# Patient Record
Sex: Male | Born: 1995 | Race: Black or African American | Marital: Single | Smoking: Current some day smoker
Health system: Northeastern US, Academic
[De-identification: ages and names within clinical notes are randomized; demographics above are authoritative.]

---
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
## 2015-01-30 IMAGING — US US RENAL
1 series · 14 of 25 positions shown · non-contrast
Comparison: None.

CLINICAL DATA: 17-year-old male with hypertension.

EXAM:
RENAL/URINARY TRACT ULTRASOUND COMPLETE

[Series 1: us renal · 0.33mm/px · 14 of 34 slices shown]
[im 1/34]
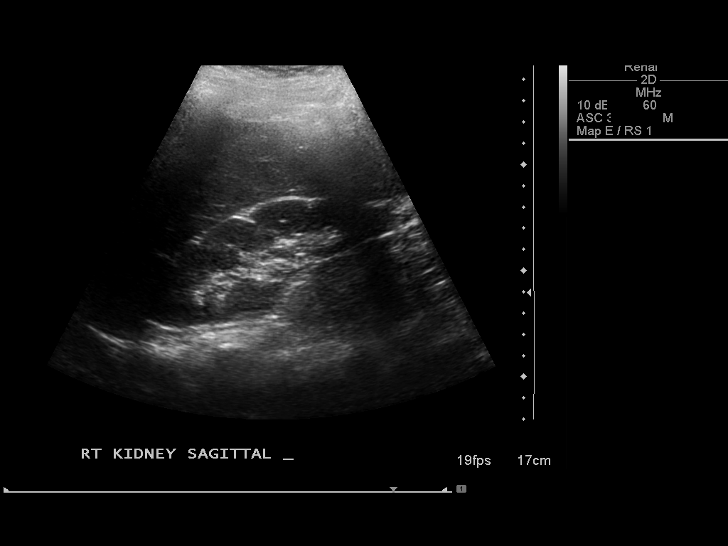
[im 3/34]
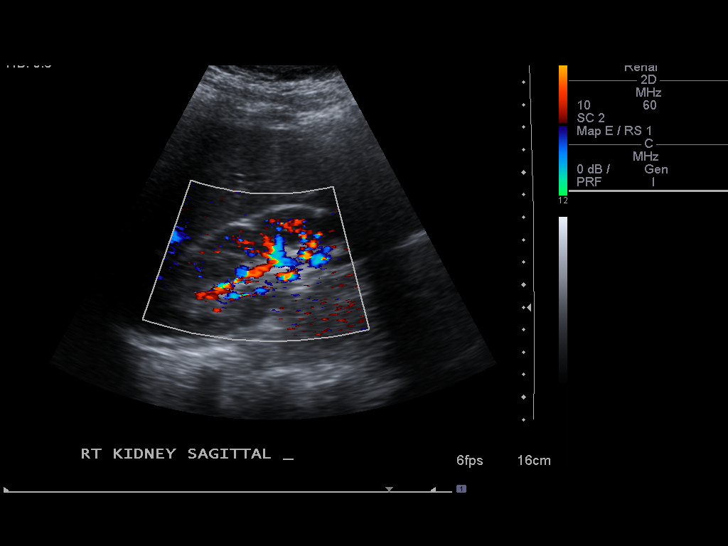
[im 6/34]
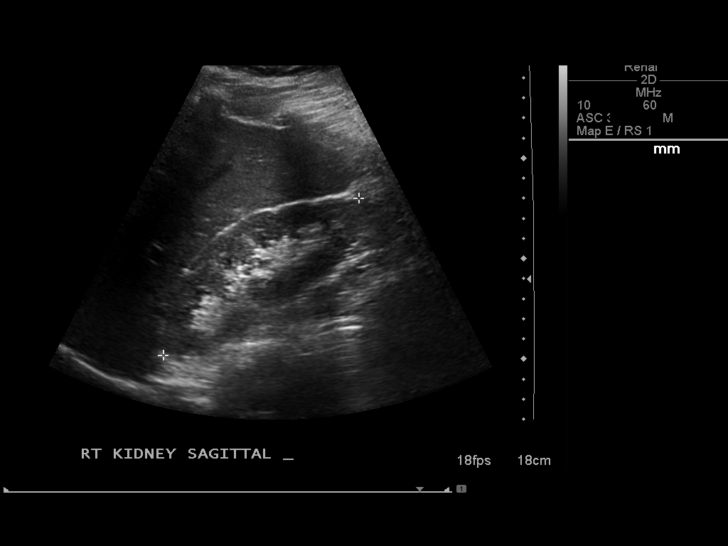
[im 9/34]
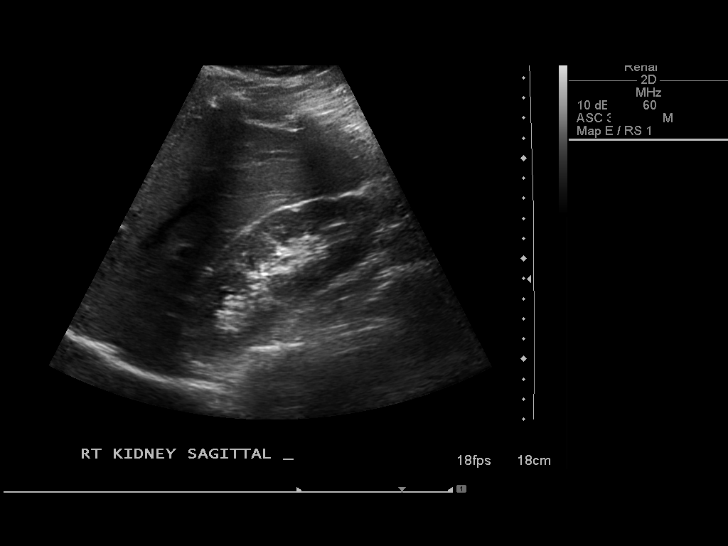
[im 12/34]
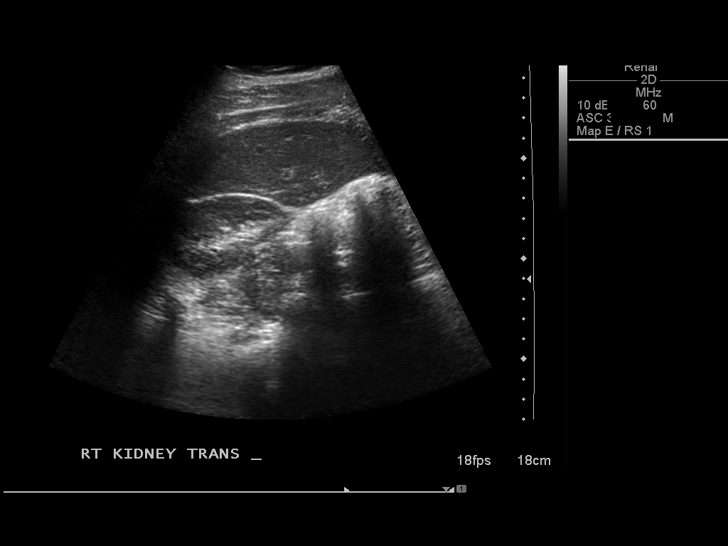
[im 13/34]
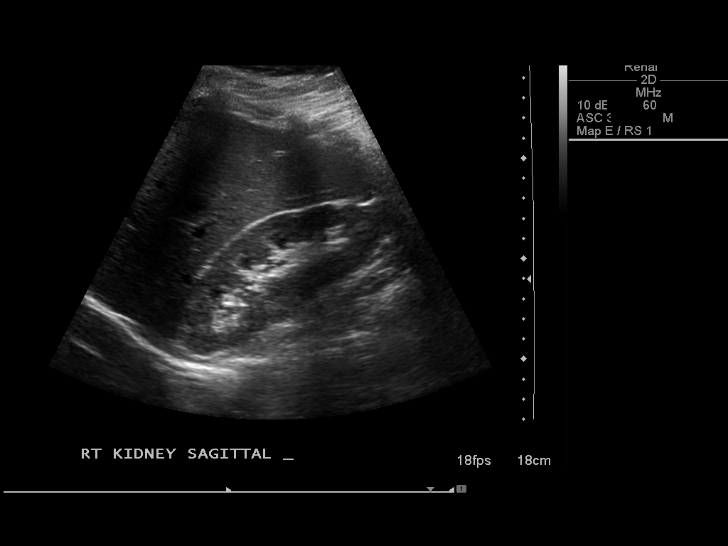
[im 16/34]
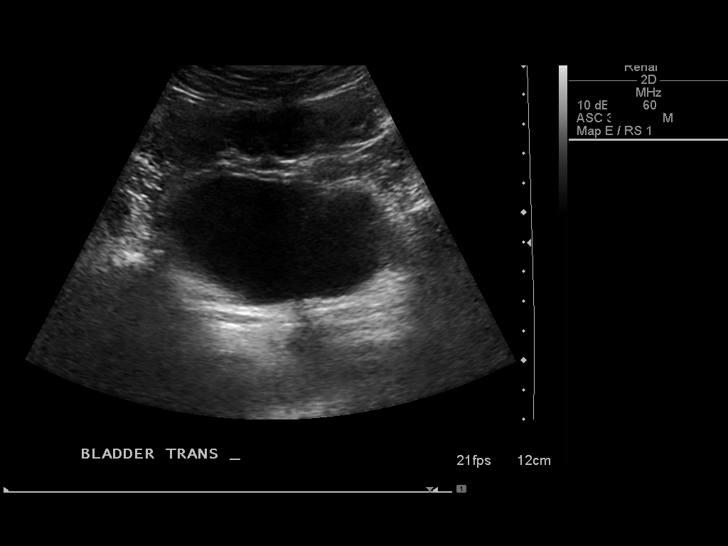
[im 18/34]
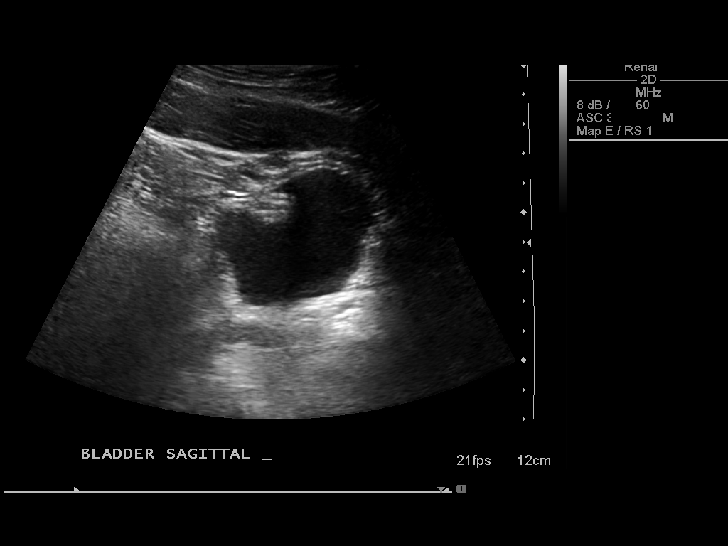
[im 21/34]
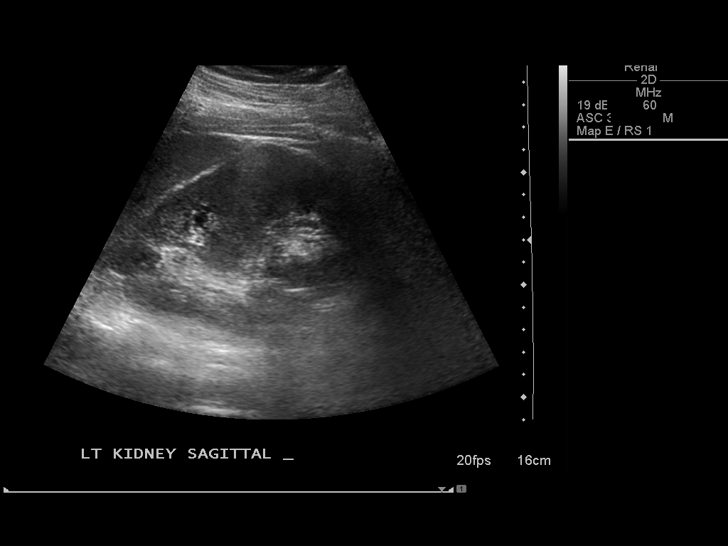
[im 23/34]
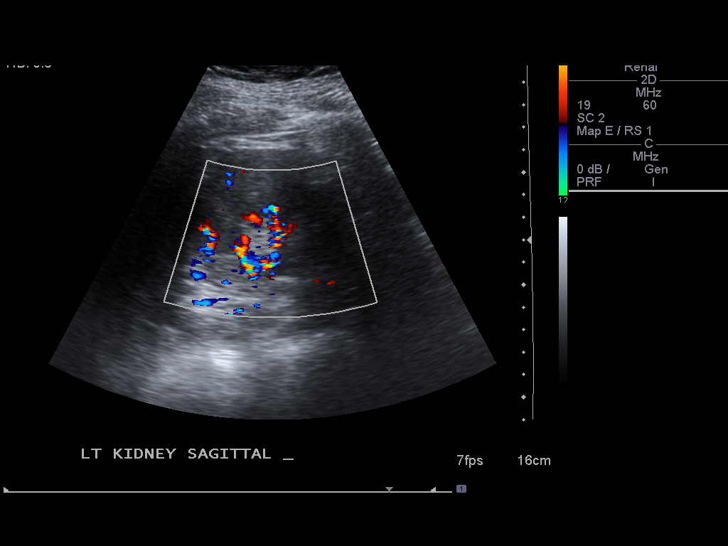
[im 25/34]
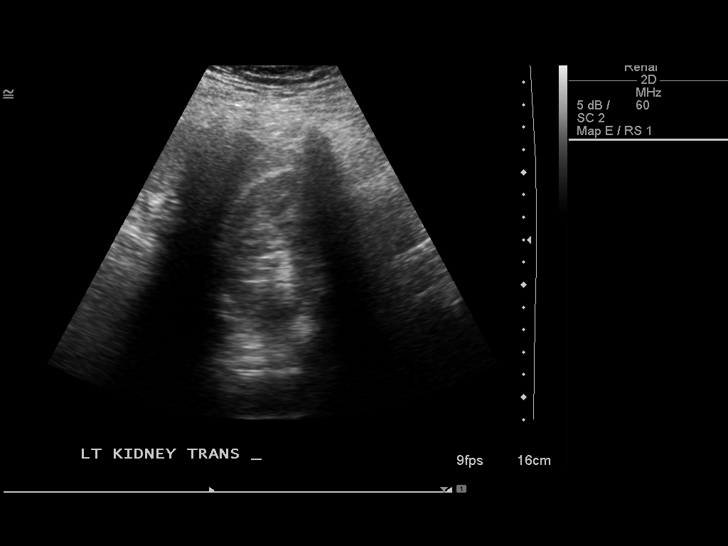
[im 28/34]
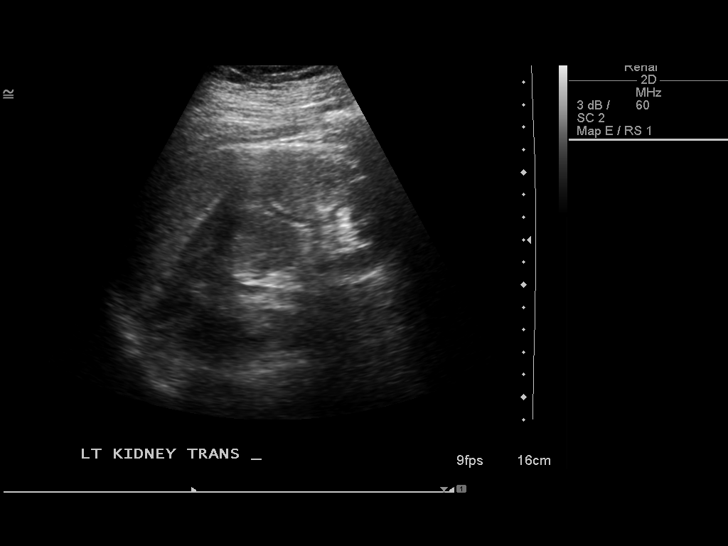
[im 31/34]
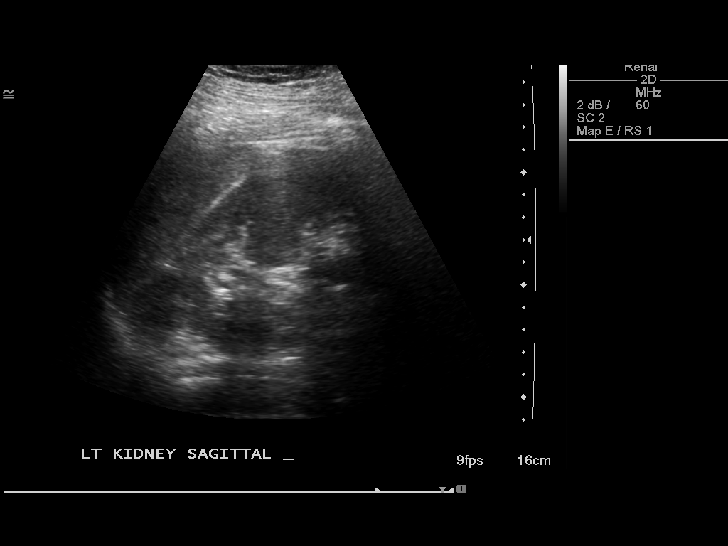
[im 34/34]
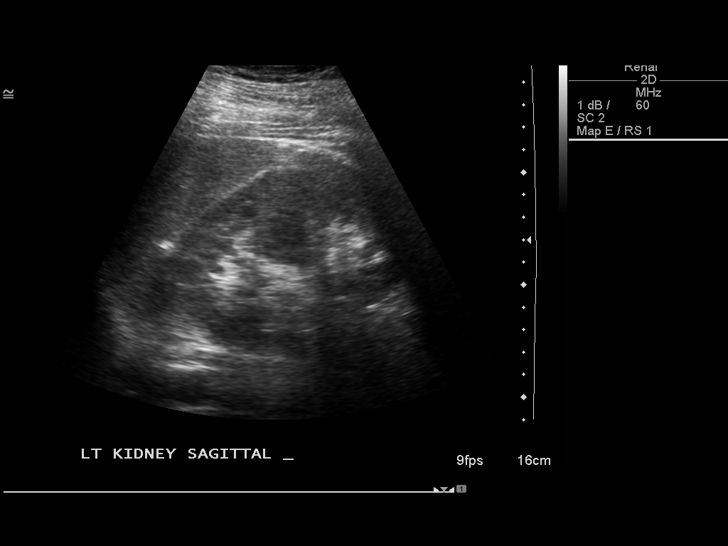

[14 of 25 positions shown; findings below may reference images not displayed]

FINDINGS: Right Kidney: No hydronephrosis. Renal length 12.5 cm. Cortical
echotexture within normal limits. No right renal lesion identified.

Left Kidney: No hydronephrosis. Cortical echotexture within normal
limits. Incidental column of Bertin. No left renal lesion.

Bladder:  Unremarkable.
IMPRESSION: Normal sonographic appearance of the kidneys and bladder.

## 2015-02-20 ENCOUNTER — Emergency Department (HOSPITAL_COMMUNITY)
Admission: EM | Admit: 2015-02-20 | Discharge: 2015-02-20 | Disposition: A | Discharge status: Home / Self Care | Payer: Medicaid Other | Attending: Emergency Medicine | Admitting: Emergency Medicine

## 2015-02-20 ENCOUNTER — Encounter (HOSPITAL_COMMUNITY): Payer: Self-pay | Admitting: *Deleted

## 2015-02-20 DIAGNOSIS — Z79899 Other long term (current) drug therapy: Secondary | ICD-10-CM | POA: Diagnosis not present

## 2015-02-20 DIAGNOSIS — R110 Nausea: Secondary | ICD-10-CM | POA: Insufficient documentation

## 2015-02-20 DIAGNOSIS — R55 Syncope and collapse: Secondary | ICD-10-CM | POA: Diagnosis present

## 2015-02-20 DIAGNOSIS — R51 Headache: Secondary | ICD-10-CM | POA: Insufficient documentation

## 2015-02-20 MED ORDER — SODIUM CHLORIDE 0.9 % IV BOLUS (SEPSIS)
1000.0000 mL | Freq: Once | INTRAVENOUS | Status: AC
  Administered 2015-02-20: 1000 mL via INTRAVENOUS

## 2015-02-20 NOTE — ED Notes (Signed)
Bed: WA06 Expected date:  Expected time:  Means of arrival:  Comments: 19 y/o syncope

## 2015-02-20 NOTE — ED Notes (Signed)
Patient presents to the ED from home with complaints of syncope earlier today.  Patient states he has had similar episodes in past - when he was 19 years old or so.  Patient denies pain, SOB, dizziness and N/V/D and fever.    On exam, patients lung sounds are clear in all fields, no wheezing.  Heart sounds S1/S2, no gallop, rub or murmur.  +3 radial and pedal pulses.  No pre-tibial and pedal edema.  Patients abdomen is soft and non-tender to palpation.  Bowel sounds hypoactive (patient states he has not eaten today).  CNIII intact, EOM intact, no astigmatism noted.  Skin warm, dry and intact.

## 2015-02-20 NOTE — ED Notes (Addendum)
Per EMS - patient was talking on phone and felt nauseated.  He got up to go to bathroom and felt as though "his legs went out from under him and he fell forward, hitting left orbital area on door.  Patient is uncertain if he lost consciousness.  Patient's step-father heard patient fall and found him on the floor.  Patient presents with some swelling around left eye.  Patient's mother stated patient's verbal responses were somewhat delayed immediately after episode.  Patient has had several similar episodes in past and was worked up by cardiology "a couple years ago."  Patient was found to have a "leaky valve," at that time.  Patient admitted to smoking marijuana just prior to incident.  Patient denies smoking marijuana prior to episodes 2 years ago.  Patient's 12 lead unremarkable.  Patient pain, dizziness and shortness of breath.  Vitals on scene negative for orthostatic changes.  BP 132/79, HR 78, 96% RA.  CBG 113.

## 2015-02-20 NOTE — Discharge Instructions (Signed)
Driving and Equipment Restrictions °Some medical problems make it dangerous to drive, ride a bike, or use machines. Some of these problems are: °· A hard blow to the head (concussion). °· Passing out (fainting). °· Twitching and shaking (seizures). °· Low blood sugar. °· Taking medicine to help you relax (sedatives). °· Taking pain medicines. °· Wearing an eye patch. °· Wearing splints. This can make it hard to use parts of your body that you need to drive safely. °HOME CARE  °· Do not drive until your doctor says it is okay. °· Do not use machines until your doctor says it is okay. °You may need a form signed by your doctor (medical release) before you can drive again. You may also need this form before you do other tasks where you need to be fully alert. °MAKE SURE YOU: °· Understand these instructions. °· Will watch your condition. °· Will get help right away if you are not doing well or get worse. °Document Released: 12/08/2004 Document Revised: 01/23/2012 Document Reviewed: 03/10/2010 °ExitCare® Patient Information ©2015 [employer]. This information is not intended to replace advice given to you by your health care provider. Make sure you discuss any questions you have with your health care provider. ° °

## 2015-02-20 NOTE — ED Provider Notes (Signed)
CSN: [id number]     Arrival date & time 02/20/15  1316 History   First MD Initiated Contact with Patient 02/20/15 1321     Chief Complaint  Patient presents with  . Loss of Consciousness     Patient is a 19 y.o. male presenting with syncope. The history is provided by the patient.  Loss of Consciousness Episode history:  Single Timing:  Sporadic Progression:  Resolved Chronicity:  New Relieved by:  Lying down Exacerbated by: standing. Associated symptoms: headaches and nausea   Associated symptoms: no chest pain, no fever, no focal weakness, no seizures, no shortness of breath and no vomiting   Risk factors: no seizures   Patient reports he was on the phone, he had mild nausea, he stood up and walked and fell down.  He hit his head on door.  He had brief LOC.  No SZ reported.  No proceeding HA before fall, but reports mild HA now.  No CP/SOB.  He only takes allergy meds.  He is active and has not had any syncope with exertion.  He was told in the the past he "leaky heart valve" but no other cardiac issues.    PMH - none Soc hx - smokes marijuana (including this morning) Family history - negative for sudden death  History  Substance Use Topics  . Smoking status: Never Smoker   . Smokeless tobacco: Never Used  . Alcohol Use: No    Review of Systems  Constitutional: Negative for fever.  Eyes: Negative for visual disturbance.  Respiratory: Negative for shortness of breath.   Cardiovascular: Positive for syncope. Negative for chest pain.  Gastrointestinal: Positive for nausea. Negative for vomiting.  Neurological: Positive for syncope and headaches. Negative for focal weakness and seizures.  All other systems reviewed and are negative.     Allergies  Banana  Home Medications   Prior to Admission medications   Medication Sig Start Date End Date Taking? Authorizing Provider  loratadine (CLARITIN) 10 MG tablet Take 10 mg by mouth daily.   Yes Historical Provider, MD   BP  123/97 mmHg  Pulse 74  Temp(Src) 97.9 F (36.6 C) (Oral)  Resp 18  SpO2 96% Physical Exam CONSTITUTIONAL: Well developed/well nourished HEAD: Normocephalic/atraumatic EYES: EOMI/PERRL ENMT: Mucous membranes moist, minimal tenderness to left forehead.  No stepoffs/laceration/deformities to face.  No septal hematoma.  No malocclusion.  No dental injury noted NECK: supple no meningeal signs SPINE/BACK:entire spine nontender CV: S1/S2 noted, no murmurs/rubs/gallops noted LUNGS: Lungs are clear to auscultation bilaterally, no apparent distress ABDOMEN: soft, nontender, no rebound or guarding, bowel sounds noted throughout abdomen GU:no cva tenderness NEURO: Pt is awake/alert/appropriate, moves all extremitiesx4.  No facial droop.  GCS 15 EXTREMITIES: pulses normal/equal, full ROM SKIN: warm, color normal PSYCH: no abnormalities of mood noted, alert and oriented to situation  ED Course  Procedures   EKG Interpretation   Date/Time:  Friday February 20 2015 13:23:16 EDT Ventricular Rate:  75 PR Interval:  173 QRS Duration: 94 QT Interval:  374 QTC Calculation: 418 R Axis:   51 Text Interpretation:  Sinus rhythm ST elev, probable normal early repol  pattern No old tracing to compare Confirmed by [facility]  MD, TREY ([id number]) on  02/20/2015 1:50:02 PM     Pt feels improve He is ambulatory Will d/c home We discussed strict return precautons Defer further imaging/testing at this time  MDM   Final diagnoses:  None    Nursing notes including past medical history and  social history reviewed and considered in documentation     Ripley Fraise, MD 02/20/15 1513

## 2016-07-10 ENCOUNTER — Emergency Department
Admission: EM | Admit: 2016-07-10 | Discharge: 2016-07-10 | Disposition: A | Discharge status: Home / Self Care | Payer: Self-pay | Source: Ambulatory Visit | Attending: Emergency Medicine | Admitting: Emergency Medicine

## 2016-07-10 DIAGNOSIS — S0502XA Injury of conjunctiva and corneal abrasion without foreign body, left eye, initial encounter: Secondary | ICD-10-CM

## 2016-07-10 LAB — HM HIV SCREENING OFFERED

## 2016-07-10 MED ORDER — ERYTHROMYCIN 5 MG/GM OP OINT *I*
TOPICAL_OINTMENT | Freq: Once | OPHTHALMIC | Status: AC
Start: 2016-07-10 — End: 2016-07-10

## 2016-07-10 MED ORDER — ERYTHROMYCIN 5 MG/GM OP OINT *I*
TOPICAL_OINTMENT | OPHTHALMIC | Status: DC
Start: 2016-07-10 — End: 2016-07-10
  Filled 2016-07-10: qty 3.5

## 2016-07-10 MED ORDER — FLUORESCEIN SODIUM 1 MG OP STRP *I*
ORAL_STRIP | OPHTHALMIC | Status: AC
Start: 2016-07-10 — End: 2016-07-10
  Filled 2016-07-10: qty 1

## 2016-07-10 NOTE — ED Notes (Signed)
Plan of Care     VS and assessment q2h, comfort measures, monitor results of diagnostic exams, medication per provider guidance, keep pt and friend apprised of medical plan and expected time frames.

## 2016-07-10 NOTE — ED Notes (Signed)
Pt here today with L eye pain.  He believes a foreign body dropped into his eye 2 days ago.  Today he presents with conjunctival erythema and blurred vision on the left.

## 2016-07-10 NOTE — ED Triage Notes (Signed)
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  Patient states he did flush out his eye but states he feels he has an irritant in his eye.  Dolphus JennyElizabeth A Nelson, RN

## 2016-07-10 NOTE — ED Provider Notes (Signed)
History     Chief Complaint   Patient presents with    Eye Problem     HPI Comments: 20 yo M c/o left eye pain x 2 days s/p feeling like dust flew in his eye while working underneath a desk and unscrewing the legs. Pt flushed his eye yesterday when the pain got worse and then started rubbing it a lot. Today it is red and the pain/feeling of irritation is worse. No foreign body sensation. Complains of slight visual distortion if he looks to the left, but denies blurry vision or vision changes otherwise, no floaters. No drainage, fevers, headache, eye swelling. +mild photophobia. Does not use contacts or glasses.      History provided by:  Patient  Language interpreter used: No    Is this ED visit related to civilian activity for income:  Not work related    History reviewed. No pertinent past medical history.     Past Surgical History:   Procedure Laterality Date    TONSILLECTOMY       No family history on file.    Social History    reports that he has been smoking.  He has never used smokeless tobacco. He reports that he does not drink alcohol or use illicit drugs. His sexual activity history is not on file.    Living Situation     Questions Responses    Patient lives with     Homeless     Caregiver for other family member     External Services     Employment     Domestic Violence Risk           Problem List   There is no problem list on file for this patient.      Review of Systems   Review of Systems   Constitutional: Negative for chills and fever.   HENT: Negative for congestion and sore throat.    Eyes: Positive for pain and redness.   Respiratory: Negative for cough and shortness of breath.    Cardiovascular: Negative for chest pain and palpitations.   Gastrointestinal: Negative for abdominal pain, constipation, diarrhea, nausea and vomiting.   Genitourinary: Negative for dysuria and hematuria.   Musculoskeletal: Negative for back pain and joint swelling.   Skin: Negative for rash.   Neurological:  Negative for syncope and headaches.   Psychiatric/Behavioral: Negative for confusion. The patient is not nervous/anxious.        Physical Exam     ED Triage Vitals   BP Heart Rate Heart Rate (via Pulse Ox) Resp Temp Temp src SpO2 O2 Device O2 Flow Rate   07/10/16 1115 07/10/16 1115 -- 07/10/16 1115 07/10/16 1115 07/10/16 1115 07/10/16 1115 07/10/16 1115 --   136/78 62  16 36 C (96.8 F) TEMPORAL 99 % None (Room air)       Weight           07/10/16 1115           142.9 kg (315 lb)               Visual Acuity  R Distance: 20/20  L Distance: 20/20    Physical Exam   Constitutional: He is oriented to person, place, and time. He appears well-developed and well-nourished. No distress.   HENT:   Head: Normocephalic and atraumatic.   Eyes: EOM are normal. Pupils are equal, round, and reactive to light. Right eye exhibits no discharge. Left eye exhibits no discharge.  OS: +scleral injection, no pain with EOM, no foreign body seen on lid eversion, +punctate fluorescin uptake seen at 3 o'clock position under woods lamp   Neck: Normal range of motion. Neck supple.   Cardiovascular: Normal rate.    Pulmonary/Chest: Effort normal.   Musculoskeletal: Normal range of motion. He exhibits no edema.   Neurological: He is alert and oriented to person, place, and time. He exhibits normal muscle tone.   Skin: Skin is warm and dry. No rash noted.   Psychiatric: He has a normal mood and affect. Thought content normal.   Nursing note and vitals reviewed.      Medical Decision Making        Initial Evaluation:  ED First Provider Contact     Date/Time Event User Comments    07/10/16 1119 ED Provider First Contact Lynnae January Initial Face to Face Provider Contact          Patient seen by me today 07/10/2016 at 1130    Assessment:  20 y.o.male comes to the ED with corneal abrasion to his left eye. No evidence of periorbital or orbital cellulitis on exam. Not a contact lens wearer.    Differential Diagnosis includes corneal abrasion, less  likely ulcer     Plan: Will give erythromycin ointment, d/c home with ointment 4x daily x 5 days, ophtho follow-up in 2-3 days, supportive care including artificial tears, return precautions. Patient expressed understanding; all questions answered.      Cora Daniels, MD           Cora Daniels, MD  07/10/16 [phone]

## 2017-01-06 ENCOUNTER — Emergency Department
Admission: EM | Admit: 2017-01-06 | Discharge: 2017-01-06 | Disposition: A | Discharge status: Home / Self Care | Payer: Self-pay | Source: Ambulatory Visit | Attending: Emergency Medicine | Admitting: Emergency Medicine

## 2017-01-06 ENCOUNTER — Other Ambulatory Visit: Payer: Self-pay | Admitting: Cardiology

## 2017-01-06 DIAGNOSIS — F419 Anxiety disorder, unspecified: Secondary | ICD-10-CM | POA: Diagnosis present

## 2017-01-06 DIAGNOSIS — E162 Hypoglycemia, unspecified: Secondary | ICD-10-CM | POA: Diagnosis present

## 2017-01-06 LAB — TSH: TSH: 0.66 u[IU]/mL (ref 0.27–4.20)

## 2017-01-06 LAB — RUQ PANEL (ED ONLY)
ALT: 38 U/L (ref 0–50)
AST: 37 U/L (ref 0–50)
Albumin: 4.6 g/dL (ref 3.5–5.2)
Alk Phos: 61 U/L (ref 40–130)
Amylase: 67 U/L (ref 28–100)
Bili,Indirect: 0.9 mg/dL (ref 0.1–1.0)
Bilirubin,Direct: 0.2 mg/dL (ref 0.0–0.3)
Bilirubin,Total: 1.1 mg/dL (ref 0.0–1.2)
Lipase: 19 U/L (ref 13–60)
Total Protein: 6.7 g/dL (ref 6.3–7.7)

## 2017-01-06 LAB — CBC AND DIFFERENTIAL
Baso # K/uL: 0 10*3/uL (ref 0.0–0.1)
Basophil %: 0.2 %
Eos # K/uL: 0.1 10*3/uL (ref 0.0–0.5)
Eosinophil %: 1.1 %
Hematocrit: 46 % (ref 40–51)
Hemoglobin: 16.2 g/dL (ref 13.7–17.5)
Lymph # K/uL: 2.3 10*3/uL (ref 1.3–3.6)
Lymphocyte %: 47.7 %
MCH: 29 pg/cell (ref 26–32)
MCHC: 35 g/dL (ref 32–37)
MCV: 83 fL (ref 79–92)
Mono # K/uL: 0.7 10*3/uL (ref 0.3–0.8)
Monocyte %: 13.7 %
Neut # K/uL: 1.8 10*3/uL (ref 1.8–5.4)
Platelets: 195 10*3/uL (ref 150–330)
RBC: 5.6 MIL/uL (ref 4.6–6.1)
RDW: 13.9 % (ref 11.6–14.4)
Seg Neut %: 37.3 %
WBC: 4.8 10*3/uL (ref 4.2–9.1)

## 2017-01-06 LAB — HOLD BLUE

## 2017-01-06 LAB — BASIC METABOLIC PANEL
Anion Gap: 12 (ref 7–16)
CO2: 23 mmol/L (ref 20–28)
Calcium: 9.9 mg/dL (ref 9.3–10.5)
Chloride: 102 mmol/L (ref 96–108)
Creatinine: 1.1 mg/dL — ABNORMAL HIGH (ref 0.50–1.00)
GFR,Black: 111 *
GFR,Caucasian: 96 *
Glucose: 137 mg/dL — ABNORMAL HIGH (ref 60–99)
Lab: 14 mg/dL (ref 6–20)
Potassium: 3.3 mmol/L (ref 3.3–5.1)
Sodium: 137 mmol/L (ref 133–145)

## 2017-01-06 LAB — T4, FREE: Free T4: 1.5 ng/dL (ref 0.9–1.7)

## 2017-01-06 MED ORDER — HYDROXYZINE HCL 25 MG PO TABS *I*
25.0000 mg | ORAL_TABLET | Freq: Once | ORAL | Status: AC
Start: 2017-01-06 — End: 2017-01-06
  Administered 2017-01-06: 25 mg via ORAL
  Filled 2017-01-06: qty 1

## 2017-01-06 NOTE — Discharge Instructions (Signed)
You were seen and this is all likely from decreased blood sugar. Make sure you are eating three meals a day, and avoid the energy drinks as much as possible.      If you develop any new or worrisome symptoms, please seek medical attention.     The following primary care physicians are  currently accepting new patients:    [facility]  [facility]  [address], [address]  [phone]  Drue Secondonald Guzman, MD, (IM)    [facility]  [address],  [address]  [phone]  Rica MastShaula Woz, MD (FM)    [facility]  [facility]  [address], [address]  [place] [id number]  [phone]  Juliet RudeNicole Vavrina, DO ([place])             [place]  [facility]  [address]  [phone]    [facility]  [address], [address]  [phone]  Marin OlpKarolina Lis-Hyjek, MD (FM)  Lavonne ChickStephen Lurie, MD, PhD (FM)  Sindy GuadeloupeNatercia Rodrigues, MD (FM)    [facility]  [facility]  [address]  [phone]  Yolanda MangesAlex Fahoury, MD (IM)  Cathie Oldenhristina William, MD (IM)    [facility]  [address] [place], [address]  [phone]  Isaac LaudAmy Yosha, MD (FM)   [facility]  [facility] [place]   [address]   [phone]   Georgia LopesPrity Rawal, MD (FM)   [facility] [place]   [address]   [phone]   Josefine ClassScott Sciarra, MD (FM)     [facility]  [facility]  [id number]  [phone]  Eustace QuailAdrian Ashdown, MD (FM)     FM: Family Medicine   IM: Internal Medicine  IM/Peds: Internal Medicine & Pediatrics  MP: Family Medicine & Pediatrics  OB Obstetrics  Peds: Pediatrics   For physician referral services, please call [phone] visit [email or web address]     Updated 11/23/2016

## 2017-01-06 NOTE — ED Notes (Signed)
Nursing Care Plan:  Will monitor and assess VS and pain scores every 2-4 hours and prn.  Perform frequent rounding prn.  Provide updates to patient and/or cargiver frequently.  Provide support to patient/caregiver as needed.  Teach patient and/or caregivers about patients needs/status working towards discharge.  Patient oriented to room and given call bell.

## 2017-01-06 NOTE — ED Notes (Signed)
Writer agrees with triage note.

## 2017-01-06 NOTE — ED Triage Notes (Addendum)
Patient arrives with c/o anxiety. He states that he feels tingly all over and has chest pressure. He states that this has been "happening for years". Patient is hyperventilating upon arrival. Patient did use marijuana today.       Triage Note   Thornton PapasMallory Contrell Ballentine, RN

## 2017-01-06 NOTE — ED Provider Notes (Signed)
History     Chief Complaint   Patient presents with    Anxiety     HPI Comments: 21 yo male with no significant past medical history presents with tingling in his fingers as well as lower legs after an episode of hypoglycemia.  EMS was called and noted that his BG was 60.  The last time he ate was last night around 10 PM.  He normally eats about 1 meal a day.  He had multiple energy drinks last night including 5 hour energy and monster.  He denies any cough, congestion or fever.  He feels like his chest is tight at this time.  He has multiple family members with type 2 diabetes.  No history of early cardiac death in the family.  He denies any travel or swelling in his legs.     He did not take anything for this. He was just home talking to his brother. He did smoke marijuana prior to this, and bought it from the same person he normally does. He denies any other issues.     Prior to arrival he did eat a sandwich, and had some crackers as well. Pain 9/10.           History provided by:  Patient  Language interpreter used: No      History reviewed. No pertinent past medical history.     Past Surgical History:   Procedure Laterality Date    TONSILLECTOMY       No family history on file.    Social History    reports that he has been smoking Cigarettes.  He has been smoking about 0.50 packs per day. He has never used smokeless tobacco. He reports that he uses illicit drugs, including Marijuana. He reports that he does not drink alcohol. His sexual activity history is not on file.    Living Situation     Questions Responses    Patient lives with Significant Other    Homeless     Caregiver for other family member     External Services     Employment     Domestic Violence Risk           Problem List     Patient Active Problem List   Diagnosis Code    Hypoglycemia E16.2    Anxiety F41.9       Review of Systems   Review of Systems   Constitutional: Negative for chills and fever.   HENT: Negative for congestion.     Respiratory: Positive for shortness of breath. Negative for cough.    Cardiovascular: Negative for chest pain.   Gastrointestinal: Negative for abdominal pain, nausea and vomiting.   Genitourinary: Negative for flank pain.   Musculoskeletal: Negative for back pain and neck pain.   Skin: Negative for rash.   Neurological: Positive for numbness. Negative for weakness, light-headedness and headaches.   Psychiatric/Behavioral: Positive for agitation. Negative for behavioral problems.       Physical Exam       ED Triage Vitals   BP Heart Rate Heart Rate (via Pulse Ox) Resp Temp Temp src SpO2 (Retired) O2 Device O2 Flow Rate   01/06/17 1544 01/06/17 1544 -- 01/06/17 1544 01/06/17 1544 01/06/17 1544 01/06/17 1544 -- --   147/72 98  22 37 C (98.6 F) TEMPORAL 100 %        Weight           01/06/17 1544  126.1 kg (278 lb)                    Physical Exam   Constitutional: He is oriented to person, place, and time. No distress.   HENT:   Head: Normocephalic and atraumatic.   MMM  No pharyngitis     Eyes: EOM are normal. Pupils are equal, round, and reactive to light.   Neck: Normal range of motion.   Cardiovascular:   Normal rate   Nl s1 and s2  No murmurs     Pulmonary/Chest: Effort normal.   Clear to auscultation bilaterally     Abdominal: Soft.   Tenderness none  No rebound  No guarding  +BS     Musculoskeletal: Normal range of motion. He exhibits no edema.   Neurological: He is alert and oriented to person, place, and time.   Sensation intact bilaterally to touch  Motor intact bilaterally, 5/5 strength       Skin: Skin is warm. He is not diaphoretic.   No lacerations or abrasions    Psychiatric:   Appears anxious   Nursing note and vitals reviewed.      Medical Decision Making      Amount and/or Complexity of Data Reviewed  Clinical lab tests: ordered and reviewed  Tests in the radiology section of CPT: ordered and reviewed  Tests in the medicine section of CPT: ordered and reviewed  Review and summarize  past medical records: yes        Initial Evaluation:  ED First Provider Contact     Date/Time Event User Comments    01/06/17 1543 ED First Provider Contact Odyn Turko Initial Face to Face Provider Contact          Patient seen by me as above    Assessment:  20 y.o.male comes to the ED with Tightness in his chest as well as tingling and numbness in his arms and legs.  Normal neurologic exam including sensation.  This is most likely related to his hypoglycemia which can certainly cause anxiety and palpitations.  Possible side effect of his marijuana use as well.  This is unlikely related to a cardiac event.     Differential Diagnosis includes   Anxiety  Hypoglycemia  Palpitations  Marijuana use  Unlikely cardiac dysrhythmia  Unlikely reactive airway based on exam  Spontaneous pneumothorax less likely based on exam  Hyperthyroidism  PE unlikely PERC negative               Wells score (Pulmonary Embolism Risk Stratification): 0  Perc Score: 0       Plan:   Interventions: By mouth hydroxyzine    Labs: CBC, BMP, right upper quadrant, TSH, free T4 shows no concerning findings     Imaging: ekg shows normal sinus rhythm  Chest x-ray shows normal chest xray    Clinical course: patient felt much better, discussed importance of eating 3 meals a day and avoiding energy drinks. This could have been due to hypoglycemia as well.     Diagnosis: hypoglycemia, anxiety     Disposition/Follow-up: discharged home with regular meals, pcp list        Weston Settlehristopher Loys Shugars, MD           Weston SettleHarmon, Carrol Bondar, MD  01/06/17 [phone]

## 2017-01-10 LAB — EKG 12-LEAD
P: 42 deg
PR: 188 ms
QRS: 50 deg
QRSD: 98 ms
QT: 352 ms
QTc: 419 ms
Rate: 85 {beats}/min
Severity: NORMAL
T: 128 deg

## 2019-09-15 ENCOUNTER — Emergency Department: Payer: No Typology Code available for payment source

## 2019-09-15 ENCOUNTER — Encounter: Payer: Self-pay | Admitting: Emergency Medicine

## 2019-09-15 ENCOUNTER — Other Ambulatory Visit: Payer: Self-pay | Admitting: Cardiology

## 2019-09-15 ENCOUNTER — Emergency Department
Admission: EM | Admit: 2019-09-15 | Discharge: 2019-09-15 | Disposition: A | Discharge status: Home / Self Care | Payer: No Typology Code available for payment source | Source: Ambulatory Visit | Attending: Emergency Medicine | Admitting: Emergency Medicine

## 2019-09-15 DIAGNOSIS — Y9389 Activity, other specified: Secondary | ICD-10-CM | POA: Insufficient documentation

## 2019-09-15 DIAGNOSIS — W1800XA Striking against unspecified object with subsequent fall, initial encounter: Secondary | ICD-10-CM | POA: Insufficient documentation

## 2019-09-15 DIAGNOSIS — W1839XA Other fall on same level, initial encounter: Secondary | ICD-10-CM

## 2019-09-15 DIAGNOSIS — S0101XA Laceration without foreign body of scalp, initial encounter: Secondary | ICD-10-CM

## 2019-09-15 DIAGNOSIS — Y909 Presence of alcohol in blood, level not specified: Secondary | ICD-10-CM | POA: Insufficient documentation

## 2019-09-15 DIAGNOSIS — Z23 Encounter for immunization: Secondary | ICD-10-CM | POA: Insufficient documentation

## 2019-09-15 DIAGNOSIS — Z7289 Other problems related to lifestyle: Secondary | ICD-10-CM | POA: Insufficient documentation

## 2019-09-15 DIAGNOSIS — F10920 Alcohol use, unspecified with intoxication, uncomplicated: Secondary | ICD-10-CM | POA: Insufficient documentation

## 2019-09-15 DIAGNOSIS — Y998 Other external cause status: Secondary | ICD-10-CM | POA: Insufficient documentation

## 2019-09-15 DIAGNOSIS — F12920 Cannabis use, unspecified with intoxication, uncomplicated: Secondary | ICD-10-CM | POA: Insufficient documentation

## 2019-09-15 DIAGNOSIS — Y9289 Other specified places as the place of occurrence of the external cause: Secondary | ICD-10-CM | POA: Insufficient documentation

## 2019-09-15 DIAGNOSIS — R9431 Abnormal electrocardiogram [ECG] [EKG]: Secondary | ICD-10-CM

## 2019-09-15 DIAGNOSIS — S060X1A Concussion with loss of consciousness of 30 minutes or less, initial encounter: Secondary | ICD-10-CM | POA: Insufficient documentation

## 2019-09-15 DIAGNOSIS — S01112A Laceration without foreign body of left eyelid and periocular area, initial encounter: Secondary | ICD-10-CM | POA: Insufficient documentation

## 2019-09-15 DIAGNOSIS — S0990XA Unspecified injury of head, initial encounter: Secondary | ICD-10-CM

## 2019-09-15 DIAGNOSIS — F10120 Alcohol abuse with intoxication, uncomplicated: Secondary | ICD-10-CM

## 2019-09-15 MED ORDER — LIDOCAINE HCL 1 % IJ SOLN *I*
10.0000 mL | Freq: Once | INTRAMUSCULAR | Status: AC
Start: 2019-09-15 — End: 2019-09-15
  Administered 2019-09-15: 10 mL via SUBCUTANEOUS
  Filled 2019-09-15: qty 20

## 2019-09-15 MED ORDER — TETANUS-DIPHTH-ACELL PERT 5-2.5-18.5 LF-MCG/0.5 IM SUSP *WRAPPED*
0.5000 mL | Freq: Once | INTRAMUSCULAR | Status: AC
Start: 2019-09-15 — End: 2019-09-15
  Administered 2019-09-15: 0.5 mL via INTRAMUSCULAR
  Filled 2019-09-15: qty 0.5

## 2019-09-15 MED ORDER — ACETAMINOPHEN 325 MG PO TABS *I*
975.0000 mg | ORAL_TABLET | Freq: Once | ORAL | Status: AC
Start: 2019-09-15 — End: 2019-09-15
  Administered 2019-09-15: 975 mg via ORAL
  Filled 2019-09-15: qty 3

## 2019-09-15 NOTE — ED Provider Notes (Addendum)
History     Chief Complaint   Patient presents with    Fall    Alcohol Intoxication     Kyle Barker is a 23 y.o. male with history of hypoglycemia and anxiety as well as cigarette smoking presents via EMS after fall with loss of consciousness resulting in laceration.  Patient states that he was smoking a blunt in the setting of significant ethanol intake and when he stood up he became lightheaded and dizzy and fell forward.  There is laceration above the left eyebrow with a dressing over it and it is hemostatic without compression. He notes that he is currently lightheaded and dizzy, although he denies fatigue, nausea, confusion, decreased appetite, or other injuries.  He has not had anything for his pain either from home or via EMS.  He denies any other recent passing out.  He denies any history of passing out with sports or family history of sudden death.          Medical/Surgical/Family History     No past medical history on file.     Patient Active Problem List   Diagnosis Code    Hypoglycemia E16.2    Anxiety F41.9            Past Surgical History:   Procedure Laterality Date    TONSILLECTOMY       No family history on file.       Social History     Tobacco Use    Smoking status: Current Some Day Smoker     Packs/day: 0.50     Types: Cigarettes    Smokeless tobacco: Never Used   Substance Use Topics    Alcohol use: No    Drug use: Yes     Types: Marijuana     Living Situation     Questions Responses    Patient lives with Significant Other    Homeless     Caregiver for other family member     External Services     Employment     Domestic Violence Risk                 Review of Systems   Review of Systems   Constitutional: Positive for fatigue. Negative for activity change, chills, diaphoresis and fever.   HENT: Negative for congestion and rhinorrhea.    Eyes: Negative for photophobia and visual disturbance.   Respiratory: Negative for cough, chest tightness, shortness of breath, wheezing and  stridor.    Cardiovascular: Negative for chest pain and palpitations.   Gastrointestinal: Negative for abdominal distention, abdominal pain, constipation, diarrhea, nausea and vomiting.   Musculoskeletal: Negative for arthralgias, myalgias and neck pain.   Skin: Positive for wound. Negative for color change, pallor and rash.   Allergic/Immunologic: Positive for environmental allergies. Negative for immunocompromised state.   Neurological: Positive for dizziness, syncope, light-headedness and headaches. Negative for tremors, seizures, speech difficulty, weakness and numbness.   Psychiatric/Behavioral: Negative for agitation, behavioral problems and confusion. The patient is not nervous/anxious.        Physical Exam     Triage Vitals  Triage Start: Start, (09/15/19 0126)   First Recorded BP: 121/75, Resp: 14, Temp: 36.1 C (97 F), Temp src: TEMPORAL Oxygen Therapy SpO2: 96 %, Oximetry Source: Rt Hand, O2 Device: None (Room air), Heart Rate: 79, (09/15/19 0128)  .      Physical Exam  Vitals signs and nursing note reviewed.   Constitutional:       General:  He is not in acute distress.     Appearance: Normal appearance. He is normal weight. He is not ill-appearing, toxic-appearing or diaphoretic.   HENT:      Head: Normocephalic. Laceration present.        Right Ear: External ear normal.      Left Ear: External ear normal.      Nose: No congestion or rhinorrhea.      Mouth/Throat:      Mouth: Mucous membranes are moist.      Pharynx: Oropharynx is clear. No oropharyngeal exudate or posterior oropharyngeal erythema.   Eyes:      Extraocular Movements: Extraocular movements intact.      Conjunctiva/sclera: Conjunctivae normal.      Pupils: Pupils are equal, round, and reactive to light.   Neck:      Musculoskeletal: Normal range of motion and neck supple.   Cardiovascular:      Rate and Rhythm: Normal rate and regular rhythm.      Pulses: Normal pulses.      Heart sounds: Normal heart sounds.   Pulmonary:      Effort:  Pulmonary effort is normal.      Breath sounds: Normal breath sounds.   Abdominal:      General: Abdomen is flat. Bowel sounds are normal.      Palpations: Abdomen is soft.      Tenderness: There is no abdominal tenderness. There is no guarding.   Musculoskeletal: Normal range of motion.      Right lower leg: No edema.      Left lower leg: No edema.   Skin:     General: Skin is warm and dry.      Coloration: Skin is not jaundiced or pale.      Findings: No rash.   Neurological:      General: No focal deficit present.      Mental Status: He is alert. He is disoriented.      Sensory: No sensory deficit.      Motor: No weakness.   Psychiatric:         Mood and Affect: Mood normal.         Behavior: Behavior normal.         Thought Content: Thought content normal.         Judgment: Judgment normal.         Medical Decision Making           Patient seen by me today 09/15/2019    Assessment:  23 y.o., male comes to the ED with laceration over left eyebrow resulting from a fall preceded by syncope.  Vitals within normal limits.  Exam benign aside from the laceration which is approximately 8 mm deep, but does not involve the galea.    Differential Diagnosis includes:  -Simple laceration  -Concussion  -Intracerebral hemorrhage  -Skull fracture  -Substance related syncope  -Cardiogenic syncope  -Vasovagal or orthostatic syncope    Plan:   -PO Tylenol  -Tdap  -Wound washout  -Laceration repair with 1% lidocaine for local  Orders Placed This Encounter   Procedures    CT head without contrast     Addendum: CT noncontrast of the head not concerning for skull fracture or intracerebral hemorrhage.  Patient's wound was thoroughly washed out and sutured with Prolene nonabsorbable simple interrupted sutures.  Patient discharged with return precautions and instructions to follow-up with PCP for wound recheck and suture removal.  Thayer Dallas, MD      Resident Attestation:    Patient seen by me on 09/15/2019.    I saw and  evaluated the patient. I agree with the resident's/fellow's findings and plan of care as documented above.  Author:  [name], MD       [name] Lain, MD  Resident  09/18/19 1129       Chipper Herb, Nadara Eaton, MD  09/18/19 1500

## 2019-09-15 NOTE — Discharge Instructions (Signed)
Your sutures need to be removed in 7 days. Follow up with an urgent care or your primary doctor for a wound check and suture removal.    You were seen for a laceration.    Please keep your wound clean and dry, refer to the wound care instructions attached.    Avoid bathing or swimming. You may shower.  Change the bandage daily or whenever it becomes soiled.  You may use Bacitracin or Polysporin ointment on the wound.    You may use Ibuprofen as directed on the bottle for pain.     If the wound becomes very red, warm, severely painful or has purulent discharge, seek medical care sooner.       [facility], DEPARTMENT OF EMERGENCY [place] INSTRUCTIONS   ADULT (>16)      A. INSTRUCTIONS FOR AN ADULT WHO WILL WATCH THE PATIENT FOR THE NEXT 24 HOURS    WHAT TO LOOK FOR   IT IS IMPORTANT that the patient be watched closely for the first 24 hours at home. A concussion can cause slow bleeding or swelling of the brain that may not be apparent at first, although after this evaluation we feel this is very unlikely.    AN ADULT SHOULD  Stay with and check the patient every 2-4 hours for at least 24 hours, while you are both awake.  Call your doctor or return to  the emergency department for the following symptoms  a. Incorrect answers to questions such as: What day is it ? Where are you? Do you remember what happened to you?  b. Unusual behavior, restlessness, combativeness. Difficulty seeing, walking or using arms.  c. Increased sleepiness or drowsiness  d. Vomiting  e. Seizures (Fits or convulsions)  f. Increased headache      B. INSTRUCTIONS FOR THE PATIENT    WHAT TO EXPECT IN THE NEXT DAYS TO WEEKS  Concussions are a common injury, and most people recover fully, usually rapidly in the first few days. If you find you are getting worse, you should see your doctor.  After a concussion you may develop the following symptoms:    HEADACHE- take acetaminophen (Tylenol) or ibuprofen (Advil, Motrin) for headache  pain  FATIGUE  DIZZINESS  IRRITABILITY AND EMOTIONAL INSTABILITY   TROUBLE WITH CONCENTRATION AND SHORT TERM MEMORY       RETURNING TO DAILY ACTIVITIES     Limit physical activity as well as activities that require a lot of thinking or concentration. These activities can make symptoms worse and slow your recovery.  Physical activities include exercising, running, cycling, weight-training, heavy lifting, etc.   Thinking activities include anything involving a screen (cell phone, computer, TV, video games), homework, reading, class work, etc.  Get lots of rest. Be sure to get enough sleep at night-no late nights. Keep the same bedtime weekdays and weekends.   Take daytime naps or rest breaks when you feel tired or fatigued.   Avoid alcohol and recreational drugs. These can make symptoms worse and slow your recovery  Drink lots of fluids and eat carbohydrates or protein to main appropriate blood sugar levels.   As symptoms decrease, you may begin to gradually return to your daily activities. If symptoms worsen or return, lessen your activities, then try again to increase your activities gradually.   During recovery, it is normal to feel frustrated and sad when you do not feel right and you cant be as active as usual.   Repeated evaluation of  your symptoms is recommended to help guide recovery.         RETURNING TO PE (GYM CLASS) AND SPORTS    A BLOW TO THE HEAD DURING THE TIME WHEN YOUR BRAIN IS RECOVERING FROM CONCUSSION CAN RESULT IN BRAIN INJURY OR, RARELY, DEATH.    YOU MAY NOT PARTICIPATE IN PE (GYM CLASS) OR ANY SPORT UNTIL YOU OBTAIN WRITTEN CLEARANCE FROM A PHYSICIAN QUALIFIED TO MANAGE SPORTS-RELATED CONCUSSION.

## 2019-09-15 NOTE — ED Notes (Signed)
Discharge paperwork reviewed, patient verbalized understanding. Wound care discussed. Patient leaving in appropriate clothing for weather conditions. Patient safe for discharge, has ride home

## 2019-09-15 NOTE — ED Triage Notes (Signed)
Pt presents to the ED via EMS after a fall. Pt fell and hit head +LOC. Pt has laceration to left eyebrow--bleeding controlled. +ETOH. Pt admits to dizziness and lightheadedness. EKG done in triage.       Triage Note   Oleh Genin, RN

## 2019-09-15 NOTE — ED Notes (Signed)
Daylight saving time occurred on this day. Events and notes between 1:00AM and 3:00AM may be out of order in the chart, where possible documentation was adjusted accordingly.

## 2019-09-17 ENCOUNTER — Encounter: Payer: Self-pay | Admitting: Emergency Medicine

## 2019-09-18 ENCOUNTER — Encounter: Payer: Self-pay | Admitting: Emergency Medicine

## 2019-09-18 NOTE — ED Procedure Documentation (Addendum)
Procedures   Laceration repair  Performed by: Everardo Pacific, MD  Authorized by: Clinton Quant, MD     Consent:     Consent obtained:  Verbal    Consent given by:  Patient    Risks discussed:  Infection, pain, poor cosmetic result, retained foreign body and poor wound healing    Alternatives discussed:  No treatment  Universal protocol:     Procedure explained and questions answered to patient or proxy's satisfaction: yes      Patient identity confirmed:  Verbally with patient and arm band  Anesthesia (see MAR for exact dosages):     Anesthesia method:  Local infiltration    Local anesthetic:  Lidocaine 1% w/o epi  Laceration details:     Location:  Face    Face location:  L eyebrow    Length (cm):  4    Depth (mm):  10  Pre-procedure details:     Preparation:  Patient was prepped and draped in usual sterile fashion and imaging obtained to evaluate for foreign bodies  Exploration:     Limited defect created (wound extended): no      Hemostasis achieved with:  Direct pressure    Wound exploration: wound explored through full range of motion and entire depth of wound probed and visualized      Wound extent: no fascia violation noted and no underlying fracture noted      Contaminated: yes    Treatment:     Area cleansed with:  Saline    Amount of cleaning:  Extensive    Irrigation solution:  Sterile saline    Irrigation volume:  500cc    Irrigation method:  Pressure wash    Visualized foreign bodies/material removed: no      Debridement:  None    Undermining:  None    Scar revision: no    Skin repair:     Repair method:  Sutures    Suture size:  4-0    Suture material:  Prolene    Suture technique:  Simple interrupted    Number of sutures:  8  Approximation:     Approximation:  Close    Vermilion border: well-aligned    Post-procedure details:     Dressing:  Antibiotic ointment and bulky dressing    Patient tolerance of procedure:  Tolerated well, no immediate complications        Garfield Cornea, MD     I was  present and participated during the critical and key portions and was immediately available during the remainder of the procedure.    Clinton Quant, MD         Clinton Quant, MD  09/18/19 [id number]

## 2019-10-02 LAB — EKG 12-LEAD
P: 33 deg
PR: 175 ms
QRS: 52 deg
QRSD: 92 ms
QT: 371 ms
QTc: 392 ms
Rate: 67 {beats}/min
T: 66 deg

## 8387-07-16 DEATH — deceased
